# Patient Record
Sex: Female | Born: 1935 | Race: White | Hispanic: No | Smoking: Never smoker
Health system: Southern US, Community
[De-identification: ages and names within clinical notes are randomized; demographics above are authoritative.]

## PROBLEM LIST (undated history)

## (undated) DIAGNOSIS — I10 Essential (primary) hypertension: Secondary | ICD-10-CM

## (undated) DIAGNOSIS — E119 Type 2 diabetes mellitus without complications: Secondary | ICD-10-CM

---

## 2016-03-04 ENCOUNTER — Emergency Department: Payer: Medicare Other

## 2016-03-04 ENCOUNTER — Encounter: Payer: Self-pay | Admitting: *Deleted

## 2016-03-04 ENCOUNTER — Emergency Department
Admission: EM | Admit: 2016-03-04 | Discharge: 2016-03-04 | Payer: Medicare Other | Attending: Emergency Medicine | Admitting: Emergency Medicine

## 2016-03-04 DIAGNOSIS — W01198A Fall on same level from slipping, tripping and stumbling with subsequent striking against other object, initial encounter: Secondary | ICD-10-CM | POA: Insufficient documentation

## 2016-03-04 DIAGNOSIS — I10 Essential (primary) hypertension: Secondary | ICD-10-CM | POA: Diagnosis not present

## 2016-03-04 DIAGNOSIS — S0990XA Unspecified injury of head, initial encounter: Secondary | ICD-10-CM | POA: Diagnosis present

## 2016-03-04 DIAGNOSIS — Y9389 Activity, other specified: Secondary | ICD-10-CM | POA: Diagnosis not present

## 2016-03-04 DIAGNOSIS — Y9289 Other specified places as the place of occurrence of the external cause: Secondary | ICD-10-CM | POA: Diagnosis not present

## 2016-03-04 DIAGNOSIS — S0181XA Laceration without foreign body of other part of head, initial encounter: Secondary | ICD-10-CM | POA: Diagnosis not present

## 2016-03-04 DIAGNOSIS — Y998 Other external cause status: Secondary | ICD-10-CM | POA: Diagnosis not present

## 2016-03-04 DIAGNOSIS — I619 Nontraumatic intracerebral hemorrhage, unspecified: Secondary | ICD-10-CM

## 2016-03-04 DIAGNOSIS — S29002A Unspecified injury of muscle and tendon of back wall of thorax, initial encounter: Secondary | ICD-10-CM | POA: Insufficient documentation

## 2016-03-04 DIAGNOSIS — S199XXA Unspecified injury of neck, initial encounter: Secondary | ICD-10-CM | POA: Insufficient documentation

## 2016-03-04 DIAGNOSIS — S0191XA Laceration without foreign body of unspecified part of head, initial encounter: Secondary | ICD-10-CM

## 2016-03-04 DIAGNOSIS — E119 Type 2 diabetes mellitus without complications: Secondary | ICD-10-CM | POA: Diagnosis not present

## 2016-03-04 DIAGNOSIS — S066X0A Traumatic subarachnoid hemorrhage without loss of consciousness, initial encounter: Secondary | ICD-10-CM | POA: Insufficient documentation

## 2016-03-04 HISTORY — DX: Essential (primary) hypertension: I10

## 2016-03-04 HISTORY — DX: Type 2 diabetes mellitus without complications: E11.9

## 2016-03-04 LAB — COMPREHENSIVE METABOLIC PANEL
ALT: 20 U/L (ref 14–54)
ANION GAP: 10 (ref 5–15)
AST: 40 U/L (ref 15–41)
Albumin: 4.5 g/dL (ref 3.5–5.0)
Alkaline Phosphatase: 53 U/L (ref 38–126)
BUN: 16 mg/dL (ref 6–20)
CHLORIDE: 104 mmol/L (ref 101–111)
CO2: 23 mmol/L (ref 22–32)
Calcium: 9.7 mg/dL (ref 8.9–10.3)
Creatinine, Ser: 0.86 mg/dL (ref 0.44–1.00)
Glucose, Bld: 272 mg/dL — ABNORMAL HIGH (ref 65–99)
Potassium: 4.1 mmol/L (ref 3.5–5.1)
SODIUM: 137 mmol/L (ref 135–145)
Total Bilirubin: 1 mg/dL (ref 0.3–1.2)
Total Protein: 8.3 g/dL — ABNORMAL HIGH (ref 6.5–8.1)

## 2016-03-04 LAB — CBC WITH DIFFERENTIAL/PLATELET
Basophils Absolute: 0 10*3/uL (ref 0–0.1)
Basophils Relative: 1 %
EOS ABS: 0.1 10*3/uL (ref 0–0.7)
Eosinophils Relative: 2 %
HEMATOCRIT: 42.7 % (ref 35.0–47.0)
HEMOGLOBIN: 14.6 g/dL (ref 12.0–16.0)
LYMPHS ABS: 2.2 10*3/uL (ref 1.0–3.6)
LYMPHS PCT: 34 %
MCH: 30.9 pg (ref 26.0–34.0)
MCHC: 34.3 g/dL (ref 32.0–36.0)
MCV: 89.9 fL (ref 80.0–100.0)
Monocytes Absolute: 0.5 10*3/uL (ref 0.2–0.9)
Monocytes Relative: 8 %
NEUTROS ABS: 3.7 10*3/uL (ref 1.4–6.5)
NEUTROS PCT: 55 %
Platelets: 177 10*3/uL (ref 150–440)
RBC: 4.75 MIL/uL (ref 3.80–5.20)
RDW: 13.6 % (ref 11.5–14.5)
WBC: 6.6 10*3/uL (ref 3.6–11.0)

## 2016-03-04 LAB — PROTIME-INR
INR: 1.17
Prothrombin Time: 15.1 seconds — ABNORMAL HIGH (ref 11.4–15.0)

## 2016-03-04 LAB — TROPONIN I

## 2016-03-04 LAB — APTT: APTT: 24 s (ref 24–36)

## 2016-03-04 MED ORDER — LIDOCAINE-EPINEPHRINE 1 %-1:200000 IJ SOLN
INTRAMUSCULAR | Status: AC
Start: 2016-03-04 — End: 2016-03-04
  Administered 2016-03-04: 14:00:00
  Filled 2016-03-04: qty 30

## 2016-03-04 MED ORDER — ONDANSETRON HCL 4 MG/2ML IJ SOLN
4.0000 mg | Freq: Once | INTRAMUSCULAR | Status: AC
  Administered 2016-03-04: 4 mg via INTRAVENOUS
  Filled 2016-03-04: qty 2

## 2016-03-04 MED ORDER — MORPHINE SULFATE (PF) 2 MG/ML IV SOLN
INTRAVENOUS | Status: AC
  Administered 2016-03-04: 2 mg via INTRAVENOUS
  Filled 2016-03-04: qty 1

## 2016-03-04 MED ORDER — MORPHINE SULFATE (PF) 4 MG/ML IV SOLN
4.0000 mg | Freq: Once | INTRAVENOUS | Status: AC
  Administered 2016-03-04: 4 mg via INTRAVENOUS
  Filled 2016-03-04: qty 1

## 2016-03-04 MED ORDER — MORPHINE SULFATE (PF) 2 MG/ML IV SOLN
2.0000 mg | Freq: Once | INTRAVENOUS | Status: AC
  Administered 2016-03-04: 2 mg via INTRAVENOUS

## 2016-03-04 NOTE — ED Notes (Signed)
c-collar removed by MD.

## 2016-03-04 NOTE — ED Provider Notes (Addendum)
[facility] Emergency Department Provider Note  ____________________________________________  Time seen: Approximately 11:43 AM  I have reviewed the triage vital signs and the nursing notes.   HISTORY  Chief Complaint Fall    HPI Debra Stout is a 82 y.o. female patient was feeling well was at the [employer] went to turn around to get some apples felt dizzy and she knew she was on the ground. She thinks she might of passed out. EMS reports witnesses said she did not. Patient did hit her head complains of headache neck pain and pain across the T-spine at about the level of the bra line possibly at T6 patient also has pain just to the left of the T-spine as well at that level pain is worse in the T-spine when she moves or takes deep breath it goes from mild to severe with movement or deep breathing. Pain in the back is sharp and stabbing headache is mild Patient has no visual changes or nausea. The injury just happened.   Past Medical History  Diagnosis Date  . Diabetes mellitus without complication (HCC)   . Hypertension     There are no active problems to display for this patient.   History reviewed. No pertinent past surgical history.  No current outpatient prescriptions on file.  Allergies Sulfa antibiotics  History reviewed. No pertinent family history.  Social History Social History  Substance Use Topics  . Smoking status: Never Smoker   . Smokeless tobacco: None  . Alcohol Use: None    Review of Systems Constitutional: No fever/chills Eyes: No visual changes. ENT: No sore throat. Cardiovascular: Denies chest pain. Respiratory: Denies shortness of breath. Gastrointestinal: No abdominal pain.  No nausea, no vomiting.  No diarrhea.  No constipation. Genitourinary: Negative for dysuria. Musculoskeletal: See history of present illness. Skin: Negative for rash. Neurological: Negative for focal weakness or numbness.  10-point ROS  otherwise negative.  ____________________________________________   PHYSICAL EXAM:  VITAL SIGNS: ED Triage Vitals  Enc Vitals Group     BP --      Pulse --      Resp --      Temp --      Temp src --      SpO2 --      Weight --      Height --      Head Cir --      Peak Flow --      Pain Score 03/04/16 1140 8     Pain Loc --      Pain Edu? --      Excl. in GC? --     Constitutional: Alert and oriented.  Eyes: Conjunctivae are normal. PERRL. EOMI. Head: Atraumatic. Nose: No congestion/rhinnorhea. Mouth/Throat: Mucous membranes are moist.  Oropharynx non-erythematous. Neck: No stridor. Complains of pain  Cardiovascular: Normal rate, regular rhythm. Grossly normal heart sounds.  Good peripheral circulation. Respiratory: Normal respiratory effort.  No retractions. Lungs CTAB. Gastrointestinal: Soft and nontender. No distention. No abdominal bruits. No CVA tenderness. There is tenderness palpation in the back at the area described above in history of present illness Musculoskeletal: No lower extremity tenderness nor edema.  No joint effusions. Neurologic:  Normal speech and language. No gross focal neurologic deficits are appreciated.  Skin:  Skin is warm, dry and intact. No rash noted. Psychiatric: Mood and affect are normal. Speech and behavior are normal.  ____________________________________________   LABS (all labs ordered are listed, but only abnormal results are displayed)  Labs Reviewed  COMPREHENSIVE METABOLIC PANEL - Abnormal; Notable for the following:    Glucose, Bld 272 (*)    Total Protein 8.3 (*)    All other components within normal limits  PROTIME-INR - Abnormal; Notable for the following:    Prothrombin Time 15.1 (*)    All other components within normal limits  CBC WITH DIFFERENTIAL/PLATELET  TROPONIN I  APTT   ____________________________________________  EKG  EKG read and interpreted by me shows sinus bradycardia rate of 47 left axis  occasional PVCs no acute ST-T wave changes. She does have right bundle branch and left anterior hemiblock. ____________________________________________  RADIOLOGY  Radiology reads a 5 x 9 mm intraparenchymal hemorrhage and left parietal area. CT of the C-spine x-rays of the chest and T-spine are normal per radiology ____________________________________________   PROCEDURES  Patient bleeding from her scalp. She remains awake alert oriented so and the wound was uncovered there is a star-shaped 3 pointed laceration over the right parietal area parieto-occipital area this was anesthetized with lidocaine and irrigated with normal saline and cleaned with Betadine and irrigated and probed no foreign bodies were seen or felt. The wound was irrigated one more time and then closed with 5 stitches of 3-0 nylon. Patient is fairly thick black hair some of that was trimmed so I could see the wound better but details of the nylon were left long facilitator location later.   Critical care time one half hour this [facility] the laceration reviewing the films ____________________________________________   INITIAL IMPRESSION / ASSESSMENT AND PLAN / ED COURSE  Pertinent labs & imaging results that were available during my care of the patient were reviewed by me and considered in my medical decision making (see chart for details).   ____________________________________________   FINAL CLINICAL IMPRESSION(S) / ED DIAGNOSES  Final diagnoses:  Intraparenchymal hemorrhage of brain (HCC)  Laceration of head, initial encounter      Arnaldo NatalPaul F Brandyn Lowrey, MD 03/04/16 1425  Note is complete  Arnaldo NatalPaul F Amoni Scallan, MD 03/14/16 1558

## 2016-03-04 NOTE — ED Notes (Signed)
[place] pt [place], denies LOC, arrives in a c-collar with a hematoma to her back right head, awake and alert in no acute distress, EDP at bedside

## 2016-03-04 NOTE — ED Notes (Signed)
EMS at bedside for transport

## 2016-03-04 NOTE — ED Notes (Signed)
Patient transported to X-ray 

## 2016-03-04 NOTE — ED Notes (Signed)
[facility]

## 2017-08-06 IMAGING — CT CT CERVICAL SPINE W/O CM
3 of 6 series · 11 of 33 positions shown, 13 images · non-contrast
Comparison: None.

CLINICAL DATA: 81-year-old female with fall and head injury today.
Acute head and cervical spine pain. Initial encounter.

EXAM:
CT HEAD WITHOUT CONTRAST
CT CERVICAL SPINE WITHOUT CONTRAST
TECHNIQUE: Multidetector CT imaging of the head and cervical spine was
performed following the standard protocol without intravenous
contrast. Multiplanar CT image reconstructions of the cervical spine
were also generated.

[Series 10: sagittal bone · sagittal · 0.26mm/px · 5 of 53 slices shown, 6 images]
[im 18/53  bone]
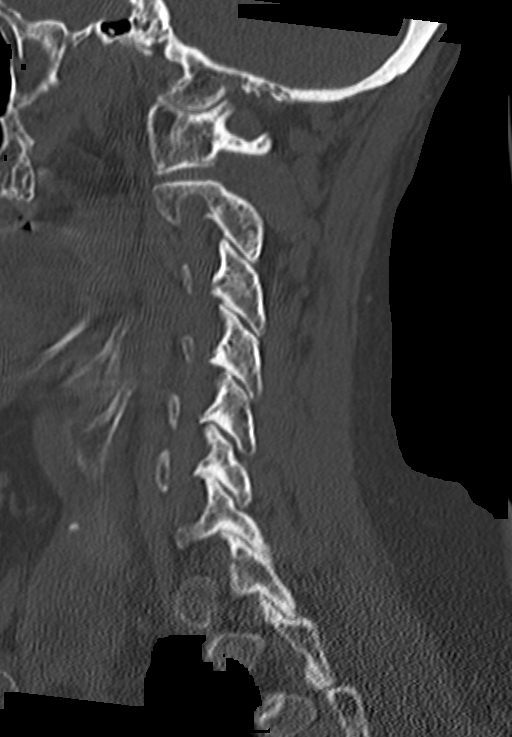
[im 22/53  bone]
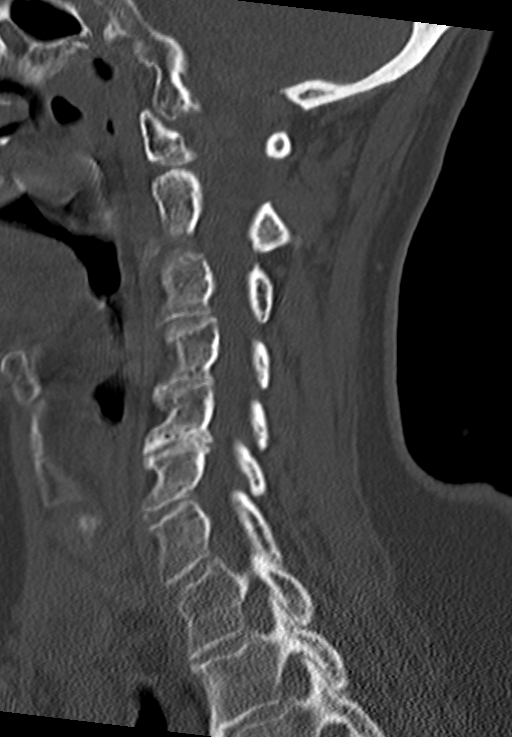
[im 27/53  soft-tissue]
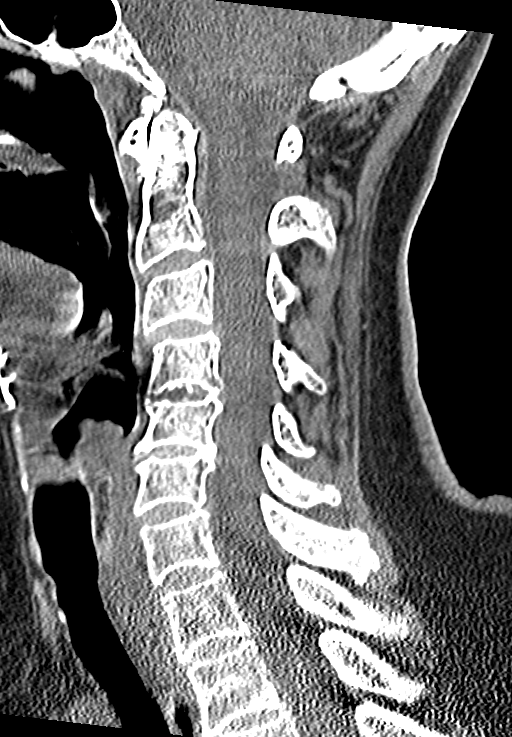
[im 27/53  bone]
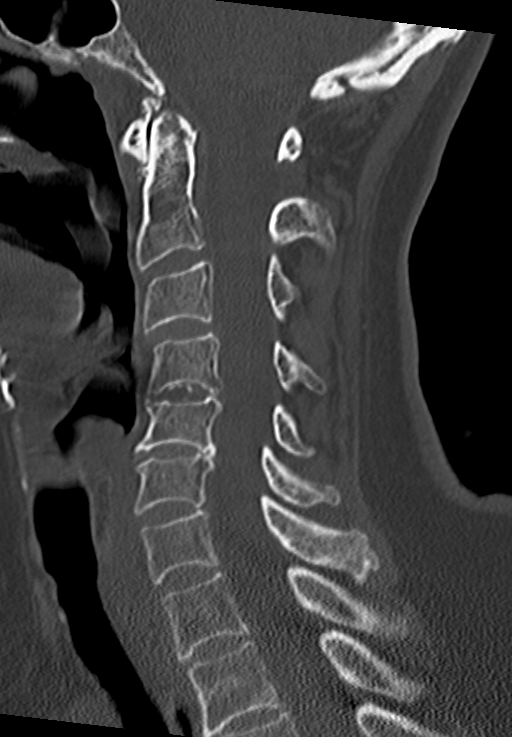
[im 31/53  bone]
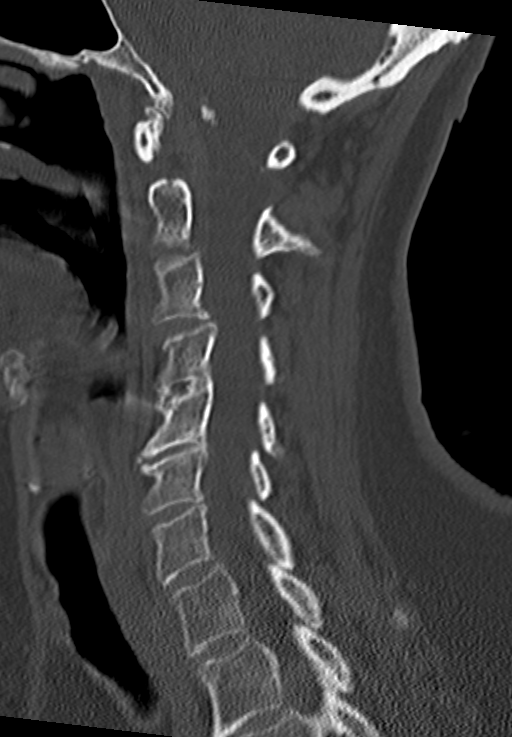
[im 35/53  bone]
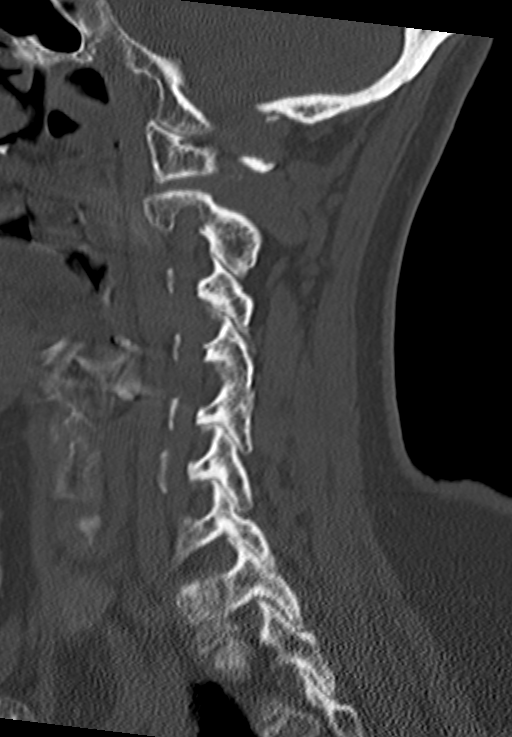

[Series 11: coronal bone · coronal · 0.22mm/px · 3 of 48 slices shown]
[im 10/48  bone]
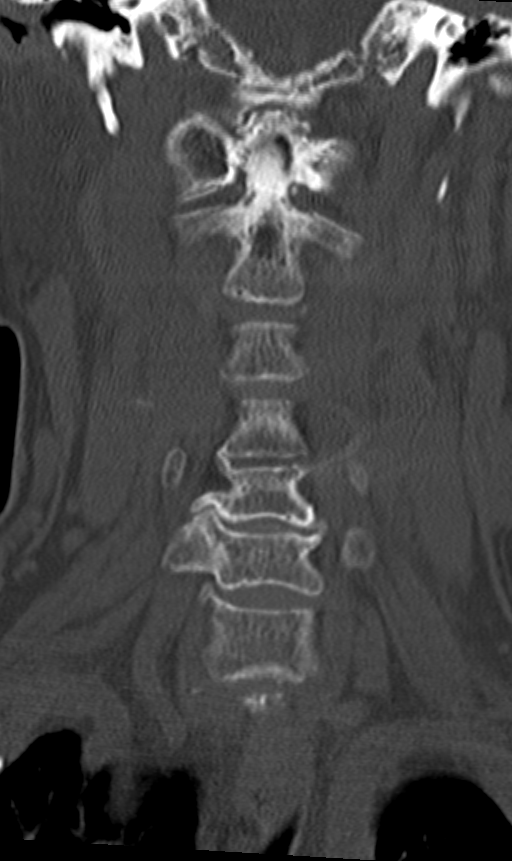
[im 19/48  bone]
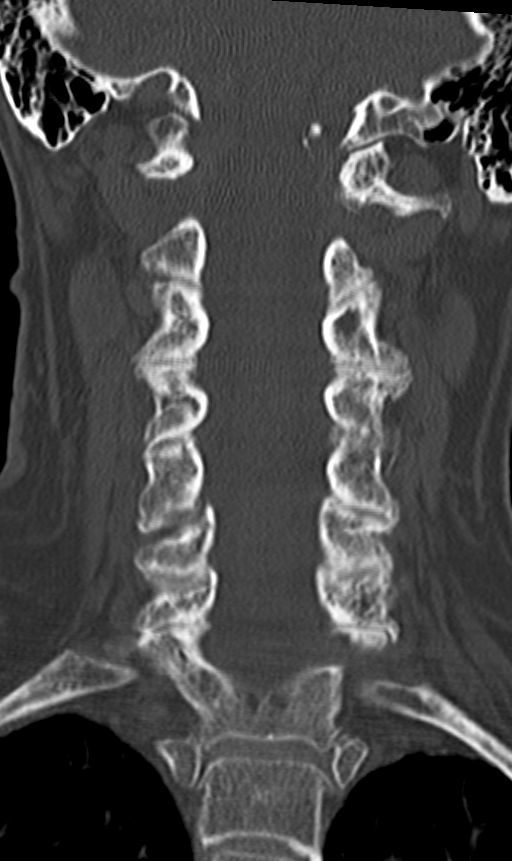
[im 29/48  bone]
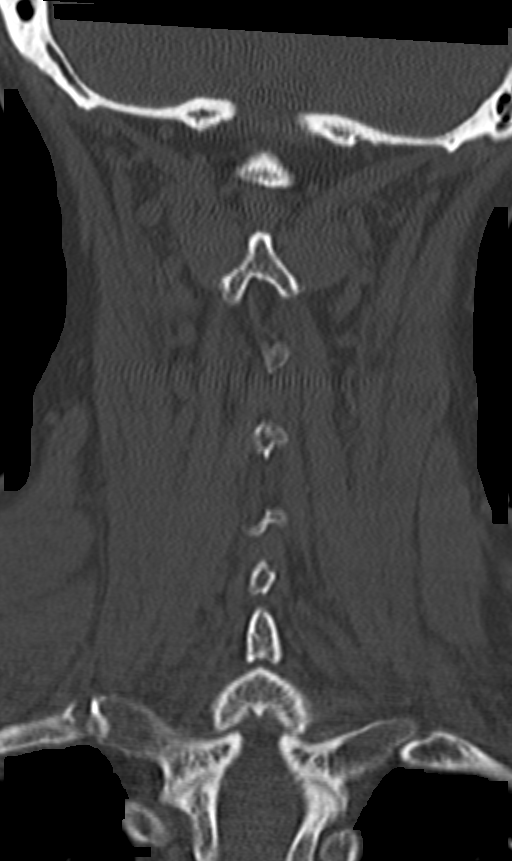

[Series 12: axial · axial · 0.23mm/px · z∈[+1055,+1145]mm · 3 of 94 slices shown, 4 images]
[im 24/94  soft-tissue]
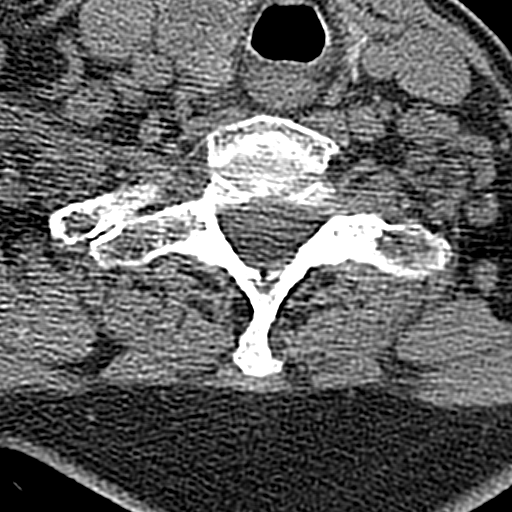
[im 24/94  bone]
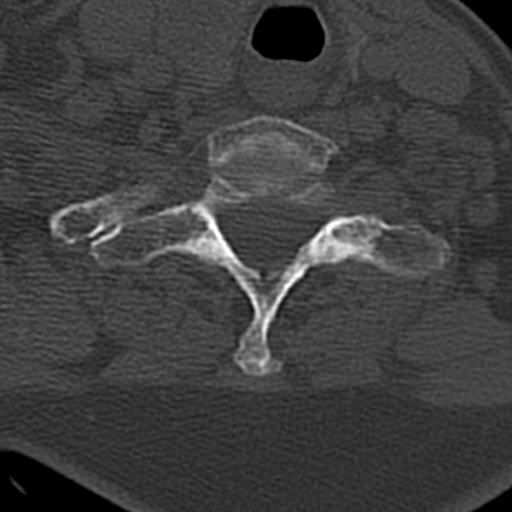
[im 47/94  bone]
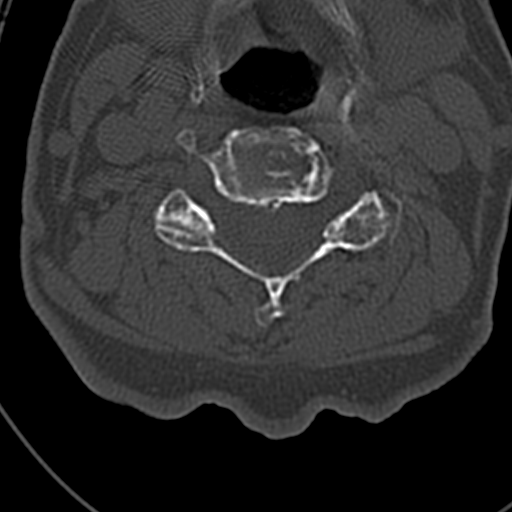
[im 70/94  bone]
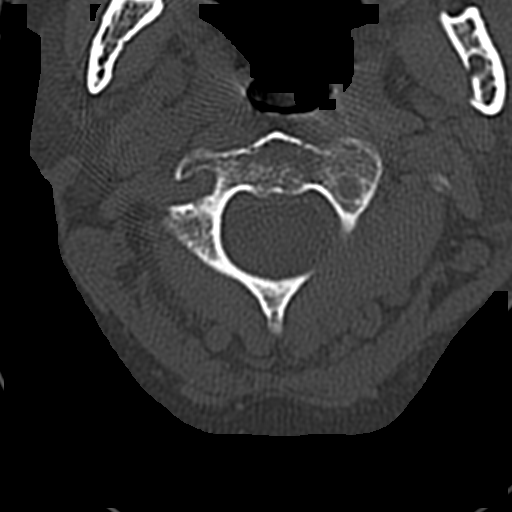

[11 of 33 positions shown; findings below may reference images not displayed]

FINDINGS: CT HEAD FINDINGS

A 5 x 9 mm hemorrhage in the left parietal region noted (image 13)
and appears intraparenchymal.

Mild chronic small-vessel white matter ischemic changes and remote
right internal capsule infarct noted.

There is no evidence of midline shift, mass fat or acute infarction.

Posterior right scalp soft tissue swelling noted.

There is no evidence of acute fracture.  The paranasal sinuses,

The visualized paranasal sinuses, mastoid air cells and middle/
inner ears are clear.

CT CERVICAL SPINE FINDINGS

Straightening of the normal cervical lordosis noted. There is no
evidence of acute fracture, subluxation or prevertebral soft tissue
swelling.

Mild -moderate degenerative disc disease and spondylosis at C4-5 and
C5-6 identified. Multilevel facet arthropathy identified.

No focal bony lesions are present.

A 2.5 cm right thyroid nodule is identified.

The visualized lung apices are clear.
IMPRESSION: 5 x 9 mm left parietal hemorrhage - favor intraparenchymal over
subarachnoid.

Posterior right scalp soft tissue swelling without acute fracture.

No static evidence of acute injury to the cervical spine.
Degenerative changes as described.

2.5 cm right thyroid nodule. Consider further evaluation with
thyroid ultrasound. If patient is clinically hyperthyroid, consider
nuclear medicine thyroid uptake and scan.

Critical Value/emergent results were called by telephone at the time
of interpretation on 03/04/2016 at [DATE] to Dr. LIEDSON VEIGA , who
verbally acknowledged these results.

## 2022-12-04 DEATH — deceased
